# Patient Record
Sex: Female | Born: 1995 | Race: Black or African American | Hispanic: No | Marital: Single | Smoking: Never smoker
Health system: Southern US, Community
[De-identification: ages and names within clinical notes are randomized; demographics above are authoritative.]

---
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
## 2015-09-25 LAB — TSH: TSH: 2.63 u[IU]/mL (ref <=5.90)

## 2015-09-25 LAB — BASIC METABOLIC PANEL: GLUCOSE: 91 mg/dL

## 2015-11-17 ENCOUNTER — Encounter: Payer: Self-pay | Admitting: Endocrinology

## 2015-12-17 ENCOUNTER — Ambulatory Visit: Payer: Self-pay | Admitting: Endocrinology

## 2015-12-18 ENCOUNTER — Telehealth: Payer: Self-pay | Admitting: Endocrinology

## 2015-12-18 NOTE — Telephone Encounter (Signed)
Patient no showed today's appt. Please advise on how to follow up. °A. No follow up necessary. °B. Follow up urgent. Contact patient immediately. °C. Follow up necessary. Contact patient and schedule visit in ___ days. °D. Follow up advised. Contact patient and schedule visit in ____weeks. ° °

## 2015-12-20 NOTE — Telephone Encounter (Signed)
Please come back for a follow-up appointment in 2-4 weeks 

## 2015-12-21 NOTE — Telephone Encounter (Signed)
Called pt to r/s missed appt, no answer.

## 2016-10-01 ENCOUNTER — Ambulatory Visit (HOSPITAL_COMMUNITY)
Admission: EM | Admit: 2016-10-01 | Discharge: 2016-10-01 | Disposition: A | Discharge status: Home / Self Care | Payer: Self-pay | Attending: Emergency Medicine | Admitting: Emergency Medicine

## 2016-10-01 ENCOUNTER — Encounter (HOSPITAL_COMMUNITY): Payer: Self-pay

## 2016-10-01 DIAGNOSIS — J111 Influenza due to unidentified influenza virus with other respiratory manifestations: Secondary | ICD-10-CM

## 2016-10-01 DIAGNOSIS — R69 Illness, unspecified: Secondary | ICD-10-CM

## 2016-10-01 MED ORDER — ONDANSETRON 4 MG PO TBDP: 4.0000 mg | ORAL_TABLET | Freq: Three times a day (TID) | ORAL | 0 refills | Status: DC | PRN

## 2016-10-01 NOTE — Discharge Instructions (Signed)
May take tylenol or ibuprofen for fever, Mucinex DM with full glass of water for cough, clear liquid diet until food can be tolerated. Should symptoms fail to improve or worsen follow up with primary care provider or return to clinic.

## 2016-10-01 NOTE — ED Triage Notes (Signed)
Pt states she hasn't been feeling well for the past 3 days. Having body aches, terrible headache, cough and hot/cold sweats along with loss of appetite. No fever. Has been taking theraflu.

## 2016-10-01 NOTE — ED Provider Notes (Signed)
CSN: [id number]     Arrival date & time 10/01/16  1633 History   First MD Initiated Contact with Patient 10/01/16 1728     Chief Complaint  Patient presents with  . Generalized Body Aches   (Consider location/radiation/quality/duration/timing/severity/associated sxs/prior Treatment) 20 year old female patient presents to clinic with 4 day history of body aches, fever, nausea, vomiting, diarrhea, cough, and congestion. Patient denies sinus pain or pressure, ear pain or discomfort, or abdominal pain. Patient has been drinking fluids but has not had anything to eat today due to nausea.   The history is provided by the patient.    History reviewed. No pertinent past medical history. History reviewed. No pertinent surgical history. Family History  Problem Relation Age of Onset  . Hypertension Mother   . Cancer Maternal Aunt    Social History  Substance Use Topics  . Smoking status: Never Smoker  . Smokeless tobacco: Never Used  . Alcohol use No   OB History    No data available     Review of Systems  Constitutional: Positive for activity change, appetite change, chills, diaphoresis, fatigue and fever.  HENT: Positive for congestion, rhinorrhea and sneezing. Negative for ear discharge, ear pain, facial swelling, nosebleeds, postnasal drip, sinus pain and sinus pressure.   Eyes: Negative for pain and itching.  Respiratory: Positive for cough. Negative for shortness of breath and wheezing.   Cardiovascular: Negative for chest pain and leg swelling.  Gastrointestinal: Positive for diarrhea, nausea and vomiting. Negative for abdominal pain.  Musculoskeletal: Positive for myalgias.  Neurological: Positive for weakness. Negative for dizziness, syncope, light-headedness and numbness.    Allergies  Patient has no known allergies.  Home Medications   Prior to Admission medications   Medication Sig Start Date End Date Taking? Authorizing Provider  ondansetron (ZOFRAN ODT) 4 MG  disintegrating tablet Take 1 tablet (4 mg total) by mouth every 8 (eight) hours as needed for nausea or vomiting. 10/01/16   Dorena BodoLawrence Anuj Summons, NP   Meds Ordered and Administered this Visit  Medications - No data to display  BP 131/95 (BP Location: Right Arm)   Pulse 115   Temp 100.2 F (37.9 C)   Resp 16   LMP 10/01/2016 (Exact Date)   SpO2 100%  No data found.   Physical Exam  Constitutional: She is oriented to person, place, and time. She appears well-developed and well-nourished. She appears ill. No distress.  HENT:  Head: Normocephalic.  Right Ear: External ear normal.  Left Ear: External ear normal.  Mouth/Throat: Oropharynx is clear and moist. No oropharyngeal exudate.  Eyes: Pupils are equal, round, and reactive to light. Right eye exhibits no discharge. Left eye exhibits no discharge.  Neck: Normal range of motion. Neck supple.  Cardiovascular: Normal rate, regular rhythm, normal heart sounds and intact distal pulses.   Pulmonary/Chest: Effort normal and breath sounds normal. No respiratory distress. She has no wheezes.  Abdominal: Soft. Bowel sounds are normal.  Lymphadenopathy:    She has no cervical adenopathy.  Neurological: She is alert and oriented to person, place, and time.  Skin: Skin is warm and dry. Capillary refill takes less than 2 seconds. She is not diaphoretic. No pallor.  Psychiatric: She has a normal mood and affect.  Nursing note and vitals reviewed.   Urgent Care Course   Clinical Course     Procedures (including critical care time)  Labs Review Labs Reviewed - No data to display  Imaging Review No results found.  Visual Acuity Review  Right Eye Distance:   Left Eye Distance:   Bilateral Distance:    Right Eye Near:   Left Eye Near:    Bilateral Near:         MDM   1. Influenza-like illness    Patient outside the therapeutic window for Tamiflu, Rx Zofran for nausea. Recommend Tylenol for fever and body aches, Mucinex DM  for cough. Recommend fluids and rest. Should symptoms fail to improve or worsen follow up with PCP or return to clinic     Dorena BodoLawrence Taino Maertens, NP 10/01/16 1747

## 2016-11-24 ENCOUNTER — Encounter (HOSPITAL_COMMUNITY): Payer: Self-pay | Admitting: *Deleted

## 2016-11-24 ENCOUNTER — Ambulatory Visit (HOSPITAL_COMMUNITY)
Admission: EM | Admit: 2016-11-24 | Discharge: 2016-11-24 | Disposition: A | Discharge status: Home / Self Care | Payer: Self-pay | Attending: Family Medicine | Admitting: Family Medicine

## 2016-11-24 DIAGNOSIS — J039 Acute tonsillitis, unspecified: Secondary | ICD-10-CM | POA: Insufficient documentation

## 2016-11-24 LAB — POCT RAPID STREP A: Streptococcus, Group A Screen (Direct): NEGATIVE

## 2016-11-24 MED ORDER — AMOXICILLIN 875 MG PO TABS: 875.0000 mg | ORAL_TABLET | Freq: Two times a day (BID) | ORAL | 0 refills | Status: DC

## 2016-11-24 NOTE — ED Provider Notes (Signed)
[facility]    CSN: [id number] Arrival date & time: 11/24/16  1735     History   Chief Complaint Chief Complaint  Patient presents with  . Sore Throat    HPI Kathryn Hunt is a 21 y.o. female.   This is a 21 year old woman who comes [facility] complaining of sore throat and fever.  Symptoms seem to start late last night and have progressed today. Patient does not have frequent sore throat. Pain is now radiating up into her right ear.  Patient's had no cough.      History reviewed. No pertinent past medical history.  There are no active problems to display for this patient.   History reviewed. No pertinent surgical history.  OB History    No data available       Home Medications    Prior to Admission medications   Medication Sig Start Date End Date Taking? Authorizing Provider  amoxicillin (AMOXIL) 875 MG tablet Take 1 tablet (875 mg total) by mouth 2 (two) times daily. 11/24/16   Elvina SidleKurt Vahe Pienta, MD    Family History Family History  Problem Relation Age of Onset  . Hypertension Mother   . Cancer Maternal Aunt     Social History Social History  Substance Use Topics  . Smoking status: Never Smoker  . Smokeless tobacco: Never Used  . Alcohol use No     Allergies   Patient has no known allergies.   Review of Systems Review of Systems  Constitutional: Positive for diaphoresis and fever.  HENT: Positive for ear pain and sore throat.   Respiratory: Negative for cough.   Cardiovascular: Negative.   Gastrointestinal: Negative.   Genitourinary: Negative.   Neurological: Negative.      Physical Exam Triage Vital Signs ED Triage Vitals  Enc Vitals Group     BP 11/24/16 1813 120/74     Pulse Rate 11/24/16 1813 114     Resp 11/24/16 1813 17     Temp 11/24/16 1813 99.5 F (37.5 C)     Temp Source 11/24/16 1813 Oral     SpO2 11/24/16 1813 100 %     Weight --      Height --      Head  Circumference --      Peak Flow --      Pain Score 11/24/16 1815 7     Pain Loc --      Pain Edu? --      Excl. in GC? --    No data found.   Updated Vital Signs BP 120/74 (BP Location: Left Arm)   Pulse 114   Temp 99.5 F (37.5 C) (Oral)   Resp 17   SpO2 100%    Physical Exam  Constitutional: She is oriented to person, place, and time. She appears well-developed and well-nourished.  HENT:  Head: Normocephalic.  Right Ear: External ear normal.  Left Ear: External ear normal.  Mouth/Throat: Oropharyngeal exudate present.  Eyes: Conjunctivae are normal. Pupils are equal, round, and reactive to light.  Neck: Normal range of motion. Neck supple.  Musculoskeletal: Normal range of motion.  Neurological: She is oriented to person, place, and time.  Skin: Skin is warm and dry.  Nursing note and vitals reviewed.    UC Treatments / Results  Labs (all labs ordered are listed, but only abnormal results are displayed) Labs Reviewed - No data to display  EKG  EKG Interpretation None  Radiology No results found.  Procedures Procedures (including critical care time)  Medications Ordered in UC Medications - No data to display   Initial Impression / Assessment and Plan / UC Course  I have reviewed the triage vital signs and the nursing notes.  Pertinent labs & imaging results that were available during my care of the patient were reviewed by me and considered in my medical decision making (see chart for details).     Final Clinical Impressions(s) / UC Diagnoses   Final diagnoses:  Tonsillitis    New Prescriptions New Prescriptions   AMOXICILLIN (AMOXIL) 875 MG TABLET    Take 1 tablet (875 mg total) by mouth 2 (two) times daily.     Elvina Sidle, MD 11/24/16 [phone]

## 2016-11-27 LAB — CULTURE, GROUP A STREP (THRC)

## 2017-01-17 ENCOUNTER — Other Ambulatory Visit (HOSPITAL_COMMUNITY)
Admission: RE | Admit: 2017-01-17 | Discharge: 2017-01-17 | Disposition: A | Discharge status: Home / Self Care | Payer: Self-pay | Source: Ambulatory Visit | Attending: Obstetrics and Gynecology | Admitting: Obstetrics and Gynecology

## 2017-01-17 ENCOUNTER — Other Ambulatory Visit: Payer: Self-pay | Admitting: Obstetrics and Gynecology

## 2017-01-17 DIAGNOSIS — Z01411 Encounter for gynecological examination (general) (routine) with abnormal findings: Secondary | ICD-10-CM | POA: Insufficient documentation

## 2017-01-20 LAB — CYTOLOGY - PAP: Diagnosis: NEGATIVE

## 2018-10-24 ENCOUNTER — Encounter (HOSPITAL_COMMUNITY): Payer: Self-pay | Admitting: Emergency Medicine

## 2018-10-24 ENCOUNTER — Ambulatory Visit (HOSPITAL_COMMUNITY)
Admission: EM | Admit: 2018-10-24 | Discharge: 2018-10-24 | Disposition: A | Discharge status: Home / Self Care | Payer: Self-pay | Attending: Family Medicine | Admitting: Family Medicine

## 2018-10-24 DIAGNOSIS — R0981 Nasal congestion: Secondary | ICD-10-CM

## 2018-10-24 DIAGNOSIS — J111 Influenza due to unidentified influenza virus with other respiratory manifestations: Secondary | ICD-10-CM

## 2018-10-24 DIAGNOSIS — R05 Cough: Secondary | ICD-10-CM | POA: Insufficient documentation

## 2018-10-24 DIAGNOSIS — R110 Nausea: Secondary | ICD-10-CM

## 2018-10-24 DIAGNOSIS — R5383 Other fatigue: Secondary | ICD-10-CM

## 2018-10-24 DIAGNOSIS — R69 Illness, unspecified: Secondary | ICD-10-CM | POA: Insufficient documentation

## 2018-10-24 DIAGNOSIS — R059 Cough, unspecified: Secondary | ICD-10-CM

## 2018-10-24 MED ORDER — HYDROCODONE-HOMATROPINE 5-1.5 MG/5ML PO SYRP: 5.0000 mL | ORAL_SOLUTION | Freq: Four times a day (QID) | ORAL | 0 refills | Status: AC | PRN

## 2018-10-24 NOTE — ED Provider Notes (Signed)
[facility]   [id number] 10/24/18 Arrival Time: 1638  ASSESSMENT & PLAN:  1. Influenza-like illness   2. Cough    See AVS for d/c instructions.  Meds ordered this encounter  Medications  . HYDROcodone-homatropine (HYCODAN) 5-1.5 MG/5ML syrup    Sig: Take 5 mLs by mouth every 6 (six) hours as needed for cough.    Dispense:  90 mL    Refill:  0   Cough medication sedation precautions. Discussed typical duration of symptoms. OTC symptom care as needed. Ensure adequate fluid intake and rest. May f/u with PCP or here as needed.  Reviewed expectations re: course of current medical issues. Questions answered. Outlined signs and symptoms indicating need for more acute intervention. Patient verbalized understanding. After Visit Summary given.   SUBJECTIVE: History from: patient.  Kathryn Hunt is a 23 y.o. female who presents with complaint of nasal congestion, post-nasal drainage, and a persistent dry cough; without sore throat. Onset abrupt, 3-4 days ago. Occasional loose stools without frank diarrhea. Mild nausea without vomiting; questions related to coughing. Overall with fatigue and with body aches. SOB: none. Wheezing: none. Fever: yes, subjective with chills. Overall normal PO intake without n/v. Known sick contacts: no. No specific or significant aggravating or alleviating factors reported. OTC treatment: Robitussin without help.  Received flu shot this year: no.  Social History   Tobacco Use  Smoking Status Never Smoker  Smokeless Tobacco Never Used    ROS: As per HPI.   OBJECTIVE:  Vitals:   10/24/18 1000  BP: (!) 176/86  Pulse: 88  Resp: 18  Temp: 98.4 F (36.9 C)  TempSrc: Oral  SpO2: 99%     General appearance: alert; appears very fatigued HEENT: nasal congestion; clear runny nose; throat irritation secondary to post-nasal drainage Neck: supple without LAD CV: RRR Lungs: unlabored respirations, symmetrical air entry without wheezing;  cough: moderate Abd: soft; non-tender Ext: no LE edema Skin: warm and dry Psychological: alert and cooperative; normal mood and affect    No Known Allergies  Family History  Problem Relation Age of Onset  . Hypertension Mother   . Cancer Maternal Aunt    Social History   Socioeconomic History  . Marital status: Single    Spouse name: Not on file  . Number of children: Not on file  . Years of education: Not on file  . Highest education level: Not on file  Occupational History  . Not on file  Social Needs  . Financial resource strain: Not on file  . Food insecurity:    Worry: Not on file    Inability: Not on file  . Transportation needs:    Medical: Not on file    Non-medical: Not on file  Tobacco Use  . Smoking status: Never Smoker  . Smokeless tobacco: Never Used  Substance and Sexual Activity  . Alcohol use: No  . Drug use: Not on file  . Sexual activity: Not Currently  Lifestyle  . Physical activity:    Days per week: Not on file    Minutes per session: Not on file  . Stress: Not on file  Relationships  . Social connections:    Talks on phone: Not on file    Gets together: Not on file    Attends religious service: Not on file    Active member of club or organization: Not on file    Attends meetings of clubs or organizations: Not on file    Relationship status: Not on file  .  Intimate partner violence:    Fear of current or ex partner: Not on file    Emotionally abused: Not on file    Physically abused: Not on file    Forced sexual activity: Not on file  Other Topics Concern  . Not on file  Social History Narrative  . Not on file           Mardella LaymanHagler, Liliann File, MD 10/24/18 1213

## 2018-10-24 NOTE — ED Notes (Signed)
Some aspects of triage are missing due to issues with Epic.  It is giving me error messages and I am unable to chart medications or travel history.  

## 2018-10-24 NOTE — ED Triage Notes (Signed)
Pt reports over two weeks ago having cough, nasal congestion, diarrhea, and vomiting induced by coughing.  She states this lasted 3-4 days.  She has had a reoccurrence that started 2-3 days ago.  She also reports an unmeasured fever.  She has been taking Robitussin and a nausea medicine she says begins with an E.

## 2018-10-24 NOTE — Discharge Instructions (Addendum)

## 2018-10-25 ENCOUNTER — Telehealth (HOSPITAL_COMMUNITY): Payer: Self-pay

## 2018-10-25 NOTE — Telephone Encounter (Signed)
Pt Mother called asking for information concerning her daughter.  Staff viewed patient chart the mother is her contact sheet but as a authorize person for medical release.

## 2018-10-29 ENCOUNTER — Telehealth (HOSPITAL_COMMUNITY): Payer: Self-pay | Admitting: Emergency Medicine

## 2018-10-29 MED ORDER — BENZONATATE 100 MG PO CAPS: 100.0000 mg | ORAL_CAPSULE | Freq: Three times a day (TID) | ORAL | 0 refills | Status: DC

## 2018-10-29 NOTE — Telephone Encounter (Signed)
Patient called asking for medicine for cough. Per Dr. Tracie Harrier, okay to send in [place]. Pt agreeable to plan, all questions answered.

## 2024-08-04 ENCOUNTER — Encounter: Payer: Self-pay | Admitting: Emergency Medicine

## 2024-08-04 ENCOUNTER — Ambulatory Visit
Admission: EM | Admit: 2024-08-04 | Discharge: 2024-08-04 | Disposition: A | Discharge status: Home / Self Care | Payer: Self-pay | Attending: Internal Medicine | Admitting: Internal Medicine

## 2024-08-04 DIAGNOSIS — Z23 Encounter for immunization: Secondary | ICD-10-CM

## 2024-08-04 DIAGNOSIS — S81812A Laceration without foreign body, left lower leg, initial encounter: Secondary | ICD-10-CM

## 2024-08-04 DIAGNOSIS — B379 Candidiasis, unspecified: Secondary | ICD-10-CM

## 2024-08-04 DIAGNOSIS — T3695XA Adverse effect of unspecified systemic antibiotic, initial encounter: Secondary | ICD-10-CM

## 2024-08-04 MED ORDER — TETANUS-DIPHTH-ACELL PERTUSSIS 5-2-15.5 LF-MCG/0.5 IM SUSP
0.5000 mL | Freq: Once | INTRAMUSCULAR | Status: AC
  Administered 2024-08-04: 0.5 mL via INTRAMUSCULAR

## 2024-08-04 MED ORDER — FLUCONAZOLE 150 MG PO TABS: 150.0000 mg | ORAL_TABLET | Freq: Once | ORAL | 0 refills | Status: AC

## 2024-08-04 MED ORDER — CEPHALEXIN 500 MG PO CAPS: 500.0000 mg | ORAL_CAPSULE | Freq: Two times a day (BID) | ORAL | 0 refills | Status: AC

## 2024-08-04 NOTE — Discharge Instructions (Signed)
 Thank you for letting me fix your cut today!  Wound care:  - Keep wound completely dry for 24 hours. After 24 hours, you may gently wash the wound by allowing water to run over the wound. You may also use a small amount of antibacterial soap.  Do not scrub the wound as this can cause damage to the sutures/staples.  - Cover the area with a nonstick bandage and change the bandage 2 times a day.   - No need for ointments or lotions to the wound.  - Cover during the day if in dirty/busy environment, leave open to air at nighttime.   You should have the sutures removed in 10 days by your primary care provider or at urgent care. Return sooner than 10 days if you experience discharge from your laceration, redness around your laceration, warmth around your laceration, or fever.   You may take over the counter medicines as needed for aches and pains once the numbing wears off.

## 2024-08-04 NOTE — ED Provider Notes (Signed)
 [facility]    CSN: [id number] Arrival date & time: 08/04/24  1033      History   Chief Complaint Chief Complaint  Patient presents with   Laceration    HPI Kathryn Hunt is a 28 y.o. female.   Kathryn Hunt is a 28 y.o. female presenting for chief complaint of Laceration to the left leg that happened approximately 1-2 hours before arrival at urgent care. Patient was walking when she accidentally cut her leg on a knife that was left in an open position. Wound bled initially, bleeding controlled with pressure. Denies numbness/tingling to the distal right leg, use of blood thinners. She is able to walk without difficulty. She's unsure of the date of her last tetanus injection.    Laceration   History reviewed. No pertinent past medical history.  There are no active problems to display for this patient.   History reviewed. No pertinent surgical history.  OB History   No obstetric history on file.      Home Medications    Prior to Admission medications   Medication Sig Start Date End Date Taking? Authorizing Provider  cephALEXin (KEFLEX) 500 MG capsule Take 1 capsule (500 mg total) by mouth 2 (two) times daily for 7 days. 08/04/24 08/11/24 Yes Enedelia Dorna HERO, FNP  fluconazole (DIFLUCAN) 150 MG tablet Take 1 tablet (150 mg total) by mouth once for 1 dose. 08/04/24 08/04/24 Yes Loriel Diehl, Dorna HERO, FNP  HYDROcodone -homatropine (HYCODAN) 5-1.5 MG/5ML syrup Take 5 mLs by mouth every 6 (six) hours as needed for cough. 10/24/18   Rolinda Rogue, MD    Family History Family History  Problem Relation Age of Onset   Hypertension Mother    Cancer Maternal Aunt     Social History Social History   Tobacco Use   Smoking status: Never   Smokeless tobacco: Never  Substance Use Topics   Alcohol use: No     Allergies   Patient has no known allergies.   Review of Systems Review of Systems Per HPI  Physical Exam Triage Vital Signs ED Triage  Vitals  Encounter Vitals Group     BP 08/04/24 1100 (!) 155/95     Girls Systolic BP Percentile --      Girls Diastolic BP Percentile --      Boys Systolic BP Percentile --      Boys Diastolic BP Percentile --      Pulse Rate 08/04/24 1100 64     Resp 08/04/24 1100 20     Temp 08/04/24 1100 98.1 F (36.7 C)     Temp Source 08/04/24 1100 Oral     SpO2 08/04/24 1100 98 %     Weight --      Height --      Head Circumference --      Peak Flow --      Pain Score 08/04/24 1101 4     Pain Loc --      Pain Education --      Exclude from Growth Chart --    No data found.  Updated Vital Signs BP (!) 155/95 (BP Location: Right Arm)   Pulse 64   Temp 98.1 F (36.7 C) (Oral)   Resp 20   SpO2 98%   Visual Acuity Right Eye Distance:   Left Eye Distance:   Bilateral Distance:    Right Eye Near:   Left Eye Near:    Bilateral Near:     Physical Exam Vitals and nursing  note reviewed.  Constitutional:      Appearance: She is not ill-appearing or toxic-appearing.  HENT:     Head: Normocephalic and atraumatic.     Right Ear: Hearing and external ear normal.     Left Ear: Hearing and external ear normal.     Nose: Nose normal.     Mouth/Throat:     Lips: Pink.  Eyes:     General: Lids are normal. Vision grossly intact. Gaze aligned appropriately.     Extraocular Movements: Extraocular movements intact.     Conjunctiva/sclera: Conjunctivae normal.  Pulmonary:     Effort: Pulmonary effort is normal.  Musculoskeletal:     Cervical back: Neck supple.     Right lower leg: Normal.     Left lower leg: Laceration present. No swelling, deformity or bony tenderness. No edema.     Comments: Ambulatory with steady gait without limp.  Skin:    General: Skin is warm and dry.     Capillary Refill: Capillary refill takes less than 2 seconds.     Findings: Laceration present. No rash.     Comments: 6cm laceration to the left.   Neurological:     General: No focal deficit present.      Mental Status: She is alert and oriented to person, place, and time. Mental status is at baseline.     Cranial Nerves: No dysarthria or facial asymmetry.  Psychiatric:        Mood and Affect: Mood normal.        Speech: Speech normal.        Behavior: Behavior normal.        Thought Content: Thought content normal.        Judgment: Judgment normal.    Left anterior shin wound after numbing (swelling due to lidocaine)   Laceration after repair    UC Treatments / Results  Labs (all labs ordered are listed, but only abnormal results are displayed) Labs Reviewed - No data to display  EKG   Radiology No results found.  Procedures Laceration Repair  Date/Time: 08/04/2024 2:18 PM  Performed by: Enedelia Dorna HERO, FNP Authorized by: Enedelia Dorna HERO, FNP   Consent:    Consent obtained:  Verbal   Consent given by:  Patient   Risks, benefits, and alternatives were discussed: yes     Risks discussed:  Infection, need for additional repair, nerve damage, pain, poor cosmetic result, poor wound healing, retained foreign body, tendon damage and vascular damage   Alternatives discussed:  No treatment Universal protocol:    Patient identity confirmed:  Verbally with patient Anesthesia:    Anesthesia method:  None Laceration details:    Location:  Leg   Leg location:  L lower leg   Length (cm):  6   Depth (mm):  7 Exploration:    Imaging outcome: foreign body not noted   Treatment:    Area cleansed with:  Povidone-iodine, soap and water and saline   Amount of cleaning:  Standard   Debridement:  None Skin repair:    Repair method:  Sutures   Suture size:  4-0   Suture material:  Nylon   Suture technique:  Simple interrupted and vertical mattress   Number of sutures:  7 (4 simple interrupted, 3 vertical mattress) Approximation:    Approximation:  Close Repair type:    Repair type:  Simple Post-procedure details:    Dressing:  Non-adherent dressing   Procedure  completion:  Tolerated well, no  immediate complications  (including critical care time)  Medications Ordered in UC Medications  Tdap (ADACEL) injection 0.5 mL (0.5 mLs Intramuscular Given 08/04/24 1118)    Initial Impression / Assessment and Plan / UC Course  I have reviewed the triage vital signs and the nursing notes.  Pertinent labs & imaging results that were available during my care of the patient were reviewed by me and considered in my medical decision making (see chart for details).   1. Laceration of left lower extremity, need for tetanus booster Laceration repaired, see procedure note above for details. Discussed wound care and cleaning at home.  I would like for her to start Keflex twice daily for 7 days today due to depth of wound and concern for infection despite prompt presentation and prompt laceration repair. Imaging: deferred given low suspicion for acute bony abnormality.  Infection return precautions discussed.  Suture removal in 10 days.  Tdap updated today.  Tylenol as needed for pain at home.  Advised to rest and avoid activities that may increase tension to wound/sutures or expose wound to infection. Excuse note given.   Counseled patient on potential for adverse effects with medications prescribed/recommended today, strict ER and return-to-clinic precautions discussed, patient verbalized understanding.    Final Clinical Impressions(s) / UC Diagnoses   Final diagnoses:  Laceration of left lower extremity, initial encounter  Need for tetanus booster     Discharge Instructions      Thank you for letting me fix your cut today!  Wound care:  - Keep wound completely dry for 24 hours. After 24 hours, you may gently wash the wound by allowing water to run over the wound. You may also use a small amount of antibacterial soap.  Do not scrub the wound as this can cause damage to the sutures/staples.  - Cover the area with a nonstick bandage and change the  bandage 2 times a day.   - No need for ointments or lotions to the wound.  - Cover during the day if in dirty/busy environment, leave open to air at nighttime.   You should have the sutures removed in 10 days by your primary care provider or at urgent care. Return sooner than 10 days if you experience discharge from your laceration, redness around your laceration, warmth around your laceration, or fever.   You may take over the counter medicines as needed for aches and pains once the numbing wears off.      ED Prescriptions     Medication Sig Dispense Auth. Provider   cephALEXin (KEFLEX) 500 MG capsule Take 1 capsule (500 mg total) by mouth 2 (two) times daily for 7 days. 14 capsule Kennedy Brines M, FNP   fluconazole (DIFLUCAN) 150 MG tablet Take 1 tablet (150 mg total) by mouth once for 1 dose. 1 tablet Enedelia Dorna HERO, FNP      PDMP not reviewed this encounter.   Enedelia Dorna HERO, [place] 08/04/24 1436

## 2024-08-04 NOTE — ED Triage Notes (Signed)
 Pt presents with laceration to left lower leg. After she ran into a knife that was on a dresser.

## 2024-08-14 ENCOUNTER — Other Ambulatory Visit: Payer: Self-pay

## 2024-08-14 ENCOUNTER — Ambulatory Visit

## 2024-08-14 ENCOUNTER — Ambulatory Visit
Admission: EM | Admit: 2024-08-14 | Discharge: 2024-08-14 | Disposition: A | Discharge status: Home / Self Care | Payer: Self-pay

## 2024-08-14 DIAGNOSIS — Z4802 Encounter for removal of sutures: Secondary | ICD-10-CM

## 2024-08-14 NOTE — ED Triage Notes (Signed)
 Pt presents to urgent care today for suture removal. Laceration to left lower leg on 10/26. Told to return in ten days for removal. No pain/concerns at this time.

## 2024-08-14 NOTE — ED Notes (Signed)
 4 suture removed at this time. Wanting [facility] NP to assess three of the sutures still in place. Swelling around the sutures. Difficult to remove.

## 2024-08-14 NOTE — ED Notes (Addendum)
 Hosey NP states there was resistance but was able to remove those three sutures. Not due to the swelling. Due to how they were placed. Vertical mattress.
